# Patient Record
Sex: Female | Born: 2005 | Hispanic: Yes | Marital: Single | Smoking: Never smoker
Health system: Southern US, Community
[De-identification: ages and names within clinical notes are randomized; demographics above are authoritative.]

## PROBLEM LIST (undated history)

## (undated) DIAGNOSIS — F32A Depression, unspecified: Secondary | ICD-10-CM

## (undated) DIAGNOSIS — R42 Dizziness and giddiness: Secondary | ICD-10-CM

## (undated) DIAGNOSIS — F419 Anxiety disorder, unspecified: Secondary | ICD-10-CM

## (undated) DIAGNOSIS — T7840XA Allergy, unspecified, initial encounter: Secondary | ICD-10-CM

## (undated) DIAGNOSIS — H539 Unspecified visual disturbance: Secondary | ICD-10-CM

## (undated) DIAGNOSIS — K5900 Constipation, unspecified: Secondary | ICD-10-CM

## (undated) DIAGNOSIS — E669 Obesity, unspecified: Secondary | ICD-10-CM

## (undated) HISTORY — DX: Unspecified visual disturbance: H53.9

## (undated) HISTORY — DX: Anxiety disorder, unspecified: F41.9

## (undated) HISTORY — PX: CLEFT PALATE REPAIR: SUR1165

## (undated) HISTORY — DX: Dizziness and giddiness: R42

## (undated) HISTORY — DX: Depression, unspecified: F32.A

## (undated) HISTORY — DX: Constipation, unspecified: K59.00

## (undated) HISTORY — DX: Obesity, unspecified: E66.9

---
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
## 2024-02-27 ENCOUNTER — Encounter: Payer: Self-pay | Admitting: *Deleted

## 2024-02-28 ENCOUNTER — Encounter (HOSPITAL_COMMUNITY): Payer: Self-pay

## 2024-02-28 ENCOUNTER — Other Ambulatory Visit: Payer: Self-pay

## 2024-02-28 ENCOUNTER — Emergency Department (HOSPITAL_COMMUNITY)
Admission: EM | Admit: 2024-02-28 | Discharge: 2024-02-28 | Disposition: A | Discharge status: Home / Self Care | Attending: Emergency Medicine | Admitting: Emergency Medicine

## 2024-02-28 ENCOUNTER — Emergency Department (HOSPITAL_COMMUNITY)

## 2024-02-28 DIAGNOSIS — X501XXA Overexertion from prolonged static or awkward postures, initial encounter: Secondary | ICD-10-CM | POA: Insufficient documentation

## 2024-02-28 DIAGNOSIS — S93401A Sprain of unspecified ligament of right ankle, initial encounter: Secondary | ICD-10-CM | POA: Diagnosis not present

## 2024-02-28 DIAGNOSIS — S80211A Abrasion, right knee, initial encounter: Secondary | ICD-10-CM | POA: Diagnosis not present

## 2024-02-28 DIAGNOSIS — M25571 Pain in right ankle and joints of right foot: Secondary | ICD-10-CM | POA: Insufficient documentation

## 2024-02-28 NOTE — Discharge Instructions (Signed)
 Please follow-up closely with orthopedics for any continued symptoms.  Return to emergency department immediately for any new or worsening symptoms.

## 2024-02-28 NOTE — ED Triage Notes (Signed)
 Pt stated that she twisted her right ankle yesterday. Some swelling present. Pt ambulatory to triage

## 2024-02-28 NOTE — ED Provider Notes (Signed)
 [place] [facility]GENC[facility] DEPARTMENT AT [facility] Provider Note   CSN: [id number] Arrival date & time: 02/28/24  1734     Patient presents with: Ankle Pain   Taygen Newsome is a 18 y.o. female.   Patient is a 18 year old female who presents to the emergency department the chief complaint of right-sided ankle pain.  Patient notes that she twisted her ankle yesterday and fell.  She denies any other secondary sites of injury or pain.  She does note that she has some swelling along the lateral aspect of the ankle.  She denies striking her head or injuring her neck or back during the fall.  She denies any numbness or paresthesias.  She notes that the fall was mechanical in nature.   Ankle Pain      Prior to Admission medications   Medication Sig Start Date End Date Taking? Authorizing Provider  amoxicillin -clavulanate (AUGMENTIN ) 875-125 MG tablet Take 1 tablet by mouth every 12 (twelve) hours. 01/18/23   Suellen Cantor A, PA-C    Allergies: Patient has no known allergies.    Review of Systems  Musculoskeletal:        Right ankle pain  All other systems reviewed and are negative.   Updated Vital Signs BP 117/66 (BP Location: Right Arm)   Pulse 79   Temp 98.4 F (36.9 C) (Oral)   Resp 18   Ht 5' 1 (1.549 m)   Wt 77.1 kg   LMP 02/28/2024 (Exact Date)   SpO2 99%   BMI 32.12 kg/m   Physical Exam Vitals and nursing note reviewed.  Constitutional:      General: She is not in acute distress.    Appearance: Normal appearance. She is not ill-appearing.  HENT:     Head: Normocephalic and atraumatic.  Eyes:     Extraocular Movements: Extraocular movements intact.     Conjunctiva/sclera: Conjunctivae normal.     Pupils: Pupils are equal, round, and reactive to light.  Cardiovascular:     Rate and Rhythm: Normal rate and regular rhythm.     Pulses: Normal pulses.     Heart sounds: Normal heart sounds.  Pulmonary:     Effort: Pulmonary effort is normal.   Musculoskeletal:        General: Normal range of motion.     Cervical back: Normal range of motion and neck supple.     Comments: Tender to palpation noted over the lateral malleolus of the right ankle, nontender palpation over the right foot diffusely, right knee, right proximal fibula, or right hip, DP and PT pulses are 2+ distally, sensation intact distally, mild edema noted over the lateral aspect of the right ankle, no obvious deformity or bruising, no skin breakdown or ulceration, no lacerations, superficial abrasions over the right knee, no overlying erythema or warmth  Skin:    General: Skin is warm and dry.  Neurological:     General: No focal deficit present.     Mental Status: She is alert and oriented to person, place, and time. Mental status is at baseline.     (all labs ordered are listed, but only abnormal results are displayed) Labs Reviewed - No data to display  EKG: None  Radiology: DG Ankle 2 Views Right Result Date: 02/28/2024 CLINICAL DATA:  Twisting injury, swelling, pain EXAM: RIGHT ANKLE - 2 VIEW COMPARISON:  07/12/2019 FINDINGS: There is no evidence of fracture, dislocation, or joint effusion. There is no evidence of arthropathy or other focal bone abnormality. Soft  tissues are unremarkable. IMPRESSION: Negative. Electronically Signed   By: Franky Crease M.D.   On: 02/28/2024 18:06     Procedures   Medications Ordered in the ED - No data to display                                  Medical Decision Making Patient is doing well at this time and is stable for discharge home.  Discussed with patient that x-rays demonstrated no signs of acute osseous injury or lesions.  Suspect ankle sprain at this time.  She is neurovascularly intact distally.  She has no other long bone or joint tenderness on exam.  She did not strike her head during the fall.  She denies injuring her neck or back.  Will place in a cam boot and recommend close follow-up with orthopedics for any  continued symptoms.  Strict return precautions were discussed for any new or worsening symptoms.  Patient voiced understanding and had no additional questions.  Amount and/or Complexity of Data Reviewed Radiology: ordered.        Final diagnoses:  None    ED Discharge Orders     None          Daralene Lonni JONETTA DEVONNA 02/28/24 1813    Towana Ozell BROCKS, MD 02/29/24 1023
